# Patient Record
Sex: Male | Born: 1960 | Race: White | Marital: Married | Smoking: Current every day smoker
Health system: Northeastern US, Academic
[De-identification: ages and names within clinical notes are randomized; demographics above are authoritative.]

## PROBLEM LIST (undated history)

## (undated) DIAGNOSIS — B029 Zoster without complications: Secondary | ICD-10-CM

## (undated) HISTORY — DX: Zoster without complications: B02.9

---

## 2010-07-07 ENCOUNTER — Encounter: Payer: Self-pay | Admitting: Primary Care

## 2010-07-07 ENCOUNTER — Ambulatory Visit: Payer: Self-pay | Admitting: Primary Care

## 2010-07-07 ENCOUNTER — Encounter: Payer: Self-pay | Admitting: Gastroenterology

## 2010-07-07 NOTE — Miscellaneous (Unsigned)
 Continuity of Care Record  Created: todo  From: ,   From:   From: [employer], EHR v10.2.7.53  To: Melgoza, Lavon  Purpose: Patient Use;       Family History  Paternal history of Lung Cancer (V16.1)   Maternal history of Dementia 28    Social History  Being A Social Drinker  Occupation:  Engineer, structural / Residence In [place]  History of Tobacco Use 1976 Resolved (V15.82) ; Resolved: Dec 2011  Marital History - Currently Married Cindy  No History of Teacher, adult education - Seatbelts  No History of Drug Use  No History of Guns In The Home  No History of Housing Without Smoke Detectors  No History of Military History    Alerts  Allergy - No Known Drug Allergy

## 2010-07-07 NOTE — Progress Notes (Signed)
 Reason For Visit   Establish care.  HPI   HTN....  historically his BP is high on first BP check though comes down with rest.    no cp or sob.  exercise is nothing formally.     Low back pain...  comes and goes with certain activity.  started with moving a heavy cylinder   out of the car.  no radiation.  no weakness.  no numbness.  no fevers.  no   change in bowel or bladder habits.  pain typically occurs with lying on his   stomach in bed, though rarely is interfering with daily activity.     this is a new patient to the strong system and therefore his family   history, social history, and medical history was populated today.  Allergies   No Known Drug Allergy.  Current Meds   None.  PMH   Herpes Zoster (Shingles) 2011 (053.9).  PSH   Arthrotomy Of Knee With Medial And Lateral Meniscectomy  Surgery Of Male Genitalia Vasectomy (V25.2).  Family Hx   Maternal history of Dementia 59  Paternal history of Lung Cancer.  Personal Hx   Being A Social Drinker  No Drug Use  No Guns In The Home  No Housing Without Smoke Detectors  Marital History - Currently Married Administrator, sports; 2 kids  No Military History  Occupation:; starting up a reality business  Tobacco Use 1976 Resolved (V15.82); Resolved: Dec 2011  Travel / Residence In The [place]  No Teacher, adult education - Seatbelts.  ROS   Systemic symptoms: No fever.  Head symptoms: Headache  come and go in the temple region and seem to occur   about 1-2 x month and caused by computer work.  Cardiovascular symptoms: No chest pain or discomfort.  Pulmonary symptoms: No dyspnea  and no cough.  Gastrointestinal symptoms: Normal appetite, no nausea, no vomiting, and no   abdominal pain.  Genitourinary symptoms: No urinary symptoms.  Neurological symptoms: No dizziness  and no lightheadedness.  Psychological symptoms: No depression.  Skin symptoms: No rash  and no mole changes.  Vital Signs   Recorded by Marshall,Veronica on 07 Jul 2010 07:54 AM  BP:160/102,  RUE,  Sitting,   HR: 85  b/min,   Temp: 97.5 F,   Height: 68.5 in, Weight: 181 lb, BMI: 27.1 kg/m2,   O2 Sat: 98 (%SpO2).  Recorded by [email or web address] on 07 Jul 2010 08:10 AM  BP:139/89,  RUE,  Sitting.  Physical Exam   GENERAL: Alert, in no apparent distress.  Normal Habitus.  Appears stated   age.    PSYCH:  good attention, well groomed, oriented, normal affect  SKIN: No petechiae, no jaundice, dry, good turgor  EYES: Anicteric, no exophthalmos.  Lids not drooping.  Sclera normal.    Pupils Equally round.  EARS: Normal pinnae.   Acuity to conversational tones is good.  MOUTH: Moist mucous membranes, normal pharynx.  Gums pink.  good dentition.  NECK: Nontender spinous processes.  Full ROM grossly.  trachea midline.  no   thyromegaly.  LYMPH:  no cervical or supraclavicular lymphadenopathy.    CHEST: Clear to auscultation bilaterally with good air movement and no   wheezes or rales.  No accessory muscle usage.  Respirations unlabored.    Chest wall movement symmetric.  CARDIAC: Regular without gallops or rubs;  no heaves, no thrills;  no   murmurs.  VASCULAR:  Capillary refill within 2 seconds.  No edema.  ABDOMEN: Soft, nontender, nondistended,  no overt hepatosplenomegaly, no   pulsatile mass or detectable aortic widening, no guarding, no rebound.  EXTREMITIES: No clubbing, no synovitis.  No obvious joint deformities.    NEURAL EXAM: Normal speech.  Gait coordinated and smooth.  Observed   dexterity without tremor.  Assessment   1.  Hypertension.  By history he states that he has had reactive   hypertension and this is supported by what I see today.  However I did   request the patient to check his blood pressure about once per week at home   on a home blood pressure readings and that if he is getting blood pressures   greater than 130/80 that he return to be reevaluated.  I counseled the   patient about the importance of aerobic exercise.  I also counseled the   patient about the benefits that likely would be seen with losing 5-10    pounds.  FU one year.  Signature   Electronically signed by: Malena Catholic  M.D.; 07/07/2010 8:22 AM EST.

## 2012-06-11 ENCOUNTER — Other Ambulatory Visit: Payer: Self-pay | Admitting: Primary Care

## 2012-06-11 ENCOUNTER — Encounter: Payer: Self-pay | Admitting: Primary Care

## 2012-06-11 DIAGNOSIS — G8929 Other chronic pain: Secondary | ICD-10-CM | POA: Insufficient documentation

## 2012-06-11 DIAGNOSIS — Z0000 Encounter for general adult medical examination without abnormal findings: Secondary | ICD-10-CM

## 2012-06-11 DIAGNOSIS — I10 Essential (primary) hypertension: Secondary | ICD-10-CM | POA: Insufficient documentation

## 2012-06-11 DIAGNOSIS — M545 Low back pain, unspecified: Secondary | ICD-10-CM | POA: Insufficient documentation

## 2012-06-21 ENCOUNTER — Ambulatory Visit
Admit: 2012-06-21 | Discharge: 2012-06-21 | Disposition: A | Discharge status: Home / Self Care | Payer: Self-pay | Source: Ambulatory Visit | Attending: Primary Care | Admitting: Primary Care

## 2012-06-21 LAB — URINALYSIS REFLEX TO CULTURE
Blood,UA: NEGATIVE
Ketones, UA: NEGATIVE
Leuk Esterase,UA: NEGATIVE
Nitrite,UA: NEGATIVE
Protein,UA: NEGATIVE mg/dL
Specific Gravity,UA: 1.013 (ref 1.002–1.030)
pH,UA: 6 (ref 5.0–8.0)

## 2012-06-21 LAB — PSA (EFF.4-2010): PSA (eff. 4-2010): 0.3 ng/mL (ref 0.00–4.00)

## 2012-06-21 LAB — COMPREHENSIVE METABOLIC PANEL
ALT: 24 U/L (ref 0–50)
AST: 25 U/L (ref 0–50)
Albumin: 4.7 g/dL (ref 3.5–5.2)
Alk Phos: 65 U/L (ref 40–130)
Anion Gap: 13 (ref 7–16)
Bilirubin,Total: 0.2 mg/dL (ref 0.0–1.2)
CO2: 26 mmol/L (ref 20–28)
Calcium: 9.7 mg/dL (ref 8.6–10.2)
Chloride: 101 mmol/L (ref 96–108)
Creatinine: 0.89 mg/dL (ref 0.67–1.17)
GFR,Black: 112 *
GFR,Caucasian: 97 *
Glucose: 107 mg/dL — ABNORMAL HIGH (ref 60–99)
Lab: 14 mg/dL (ref 6–20)
Potassium: 4.8 mmol/L (ref 3.3–5.1)
Sodium: 140 mmol/L (ref 133–145)
Total Protein: 7.2 g/dL (ref 6.3–7.7)

## 2012-06-21 LAB — LDL CHOLESTEROL, DIRECT: LDL Direct: 121 mg/dL

## 2012-06-21 LAB — LIPID PANEL
Chol/HDL Ratio: 7.6
Cholesterol: 229 mg/dL — AB
HDL: 30 mg/dL
Non HDL Cholesterol: 199 mg/dL
Triglycerides: 461 mg/dL — AB

## 2012-06-21 LAB — CBC AND DIFFERENTIAL
Baso # K/uL: 0 10*3/uL (ref 0.0–0.1)
Basophil %: 0.2 % (ref 0.2–1.2)
Eos # K/uL: 0.1 10*3/uL (ref 0.0–0.5)
Eosinophil %: 1.3 % (ref 0.8–7.0)
Hematocrit: 44 % (ref 40–51)
Hemoglobin: 14.9 g/dL (ref 13.7–17.5)
Lymph # K/uL: 2.8 10*3/uL (ref 1.3–3.6)
Lymphocyte %: 30.1 % (ref 21.8–53.1)
MCV: 93 fL — ABNORMAL HIGH (ref 79–92)
Mono # K/uL: 0.8 10*3/uL (ref 0.3–0.8)
Monocyte %: 8.8 % (ref 5.3–12.2)
Neut # K/uL: 5.6 10*3/uL — ABNORMAL HIGH (ref 1.8–5.4)
Platelets: 247 10*3/uL (ref 150–330)
RBC: 4.8 MIL/uL (ref 4.6–6.1)
RDW: 13.7 % (ref 11.6–14.4)
Seg Neut %: 59.6 % (ref 34.0–67.9)
WBC: 9.4 10*3/uL — ABNORMAL HIGH (ref 4.2–9.1)

## 2012-06-21 LAB — HEMOGLOBIN A1C: Hemoglobin A1C: 5.7 % (ref 4.0–6.0)

## 2012-06-21 LAB — TSH: TSH: 2.93 u[IU]/mL (ref 0.27–4.20)

## 2012-06-22 LAB — HEPATITIS C ANTIBODY: Hep C Ab: NEGATIVE

## 2012-07-05 ENCOUNTER — Encounter: Payer: Self-pay | Admitting: Primary Care

## 2012-07-05 ENCOUNTER — Ambulatory Visit: Payer: Self-pay | Admitting: Primary Care

## 2012-07-05 VITALS — BP 120/82 | HR 55 | Temp 98.6°F | Ht 68.5 in | Wt 180.0 lb

## 2012-07-05 DIAGNOSIS — F17200 Nicotine dependence, unspecified, uncomplicated: Secondary | ICD-10-CM

## 2012-07-05 DIAGNOSIS — M25572 Pain in left ankle and joints of left foot: Secondary | ICD-10-CM

## 2012-07-05 DIAGNOSIS — Z0000 Encounter for general adult medical examination without abnormal findings: Secondary | ICD-10-CM

## 2012-07-05 DIAGNOSIS — E785 Hyperlipidemia, unspecified: Secondary | ICD-10-CM | POA: Insufficient documentation

## 2012-07-05 LAB — HM HIV SCREENING OFFERED

## 2012-07-05 NOTE — H&P (Signed)
Reason For Visit   Adult Comprehensive Physical Exam.    HPI   Today has concerns of:  1.  Injured left ankle in May 2013.  Still with some discomfort with walking, especially in the achilles region, and pain can radiate into the calf pain.  2.  Spot on back that his wife has noticed.  No known changes.  No pain.  3.  Small lump on back of right knee.   No pain.  4.  Toenail fungus.  "it is ugly".  Has not tried any treatment.    Entire chart (including Problem List, SH, FH, Immunizations, Medications, and Allergies) was reviewed with the patient today during the appointment and updates were made based upon our discussion.  Allergies   Allergen Reactions   . Venomil Wasp Venom (Wasp Venom) Anaphylaxis     Current Outpatient Prescriptions   Medication Sig Dispense Refill   . EPINEPHrine (EPIPEN) 0.3 MG/0.3ML DEVI Inject 0.3 mg into the muscle once       . B Complex Vitamins (VITAMIN B COMPLEX PO) Take by mouth         No current facility-administered medications for this visit.       Patient Active Problem List    Diagnosis Date Noted   . Nicotine dependence 07/05/2012   . Reactive hypertension 06/11/2012   . Chronic low back pain 06/11/2012     Past Medical History   Diagnosis Date   . Herpes zoster without mention of complication      Past Surgical History   Procedure Laterality Date   . Meniscectomy Right      Medial and Lateral Meniscectomy   . Vasectomy       Family History   Problem Relation Age of Onset   . Dementia Mother 31   . Lung cancer Father    . Breast cancer Mother 59     History     Social History   . Marital Status: Married     Spouse Name: Arline Asp     Number of Children: 2   . Years of Education: 16     Occupational History   . Reality Business      Social History Main Topics   . Smoking status: Current Every Day Smoker -- 0.50 packs/day     Types: Cigarettes     Start date: 05/18/1974   . Smokeless tobacco: Never Used   . Alcohol Use: 1 - 1.5 oz/week     2-3 drink(s) per week   . Drug Use: No   .  Sexually Active: Yes -- Male partner(s)     Other Topics Concern   . None     Social History Narrative   . None     ROS   Systemic symptoms: No fever.  Head symptoms:  Infrequent headache.  Eye symptoms: No vision problems, except for readers  Cardiovascular symptoms: No chest pain or discomfort.  Pulmonary symptoms: No dyspnea  and no cough.  Gastrointestinal symptoms: Normal appetite, no nausea, no vomiting,   Genitourinary symptoms: Nocturia x 0  Neurological symptoms: No dizziness  and no lightheadedness.  Psychological symptoms: No depression.  Skin symptoms: No rash.    There is no immunization history on file for this patient.    Filed Vitals:    07/05/12 1110   BP: 120/82   Pulse: 55   Temp: 37 C (98.6 F)   Height: 1.74 m (5' 8.5")   Weight: 81.647 kg (180 lb)  BP  138/90 RUE sitting  Body mass index is 26.97 kg/(m^2).  Physical Exam   GENERAL: Alert, in no apparent distress, good attention, well groomed.  Oveweight Habitus.  Appears stated age  SKIN: No petechiae, no jaundice, no tinea pedis, back with 4mm flesh colored papule.  Posterior right thigh with 3mm sq nodule.  EYES: Anicteric, no exophthalmos.  Lids not drooping.  Sclera normal.  EARS: Normal pinnae.  External auditory canal intact, clear, and without lesions.  Normal tympanic membrane with light reflex and landmarks.  Acuity to conversational tones is good.  NOSE: Normal pink mucosa without swollen turbinates.  Septum appears deviated to the left.  MOUTH: Moist mucous membranes, normal pharynx, palate symmetrical, no thrush, normal tongue, no tonsillar exudate.  Gums pink.  NECK: Nontender spinous processes.  Full ROM grossly.  THYROID: Normal size, nontender, no masses.  LYMPH:  no cervical or supraclavicular lymphadenopathy.  No axillary lymphadenopathy.  BREASTS: Symmetrical, no dimpling, no nipple discharge.  CHEST: Clear to auscultation bilaterally with good air movement and no wheezes or rales.  No accessory muscle usage.   Respirations unlabored.  Chest wall movement symmetric.  CARDIAC: Regular without gallops or rubs;  no heaves, no thrills;  no murmurs.  VASCULAR: 2+ bilateral dorsal pedis pulses, 2+ bilateral posterior tibialis pulses, positive hair on toes. No carotid bruit, no aortic bruit, no renal bruit, no supraclavicular bruit.  Capillary refill within 2 seconds.  No edema.  ABDOMEN: Soft, nontender, nondistended, good bowel sounds, no overt hepatosplenomegaly, no pulsatile mass or detectable aortic widening, no guarding, no rebound.  BACK: No CVA tenderness, no spinous process tenderness, no scoliosis  EXTREMITIES: No clubbing, no synovitis.  No obvious joint deformities.  Muscle tone and gross strength assessment appear normal for age,  without atrophy.  GU:    Normal testis.  Normal epididymis.  Normal penis without discharge or ulcerations.  Small right inguinal hernia.  RECTUM:  No masses, brown stool, normal sphincter tone.  No hemorrhoids.  Perineum and anus without lesions, or masses.  Prostate exam limited by pt resistance.  NEURAL EXAM: Normal speech, normal sensation to light touch, no clonus, negative straight leg raises.   Gait coordinated and smooth.  Observed dexterity without tremor.  Recall of recent and remote events appears normal.    POCT - EKG:    EKG Interpretation:   NSR      Rate:  62  Comments:  Inverted Jeanmarie Mccowen wave in lead 3.  Borderline intraventricular conduction delay with QRS interval of 0.1 seconds.  No old EKG.  No acute changes      Chemistry        Lab results: 06/21/12  0825   Sodium 140   Potassium 4.8   Chloride 101   CO2 26   GFR,Caucasian 97   GFR,Black 112   UN 14   Creatinine 0.89        Lab results: 06/21/12  0825   Glucose 107*   Calcium 9.7   Total Protein 7.2   Albumin 4.7   ALT 24   AST 25   Alk Phos 65   Bilirubin,Total 0.2          Lab Results   Component Value Date    WBC 9.4* 06/21/2012    HGB 14.9 06/21/2012    HCT 44 06/21/2012    MCV 93* 06/21/2012    PLT 247 06/21/2012     Lab Results    Component Value Date  CHOL 229* 06/21/2012    HDL 30 06/21/2012    LDLC see below 06/21/2012    TRIG 461* 06/21/2012    CHHDC 7.6 06/21/2012     Hemoglobin A1C   Date Value Range Status   06/21/2012 5.7  4.0 - 6.0 % Final                                    NON-DIABETIC       Lab Results   Component Value Date    TSH 2.93 06/21/2012     Assessment   Mark Mcneil comes in today for a physical exam and the following issues were identified:  1.  Nicotine dependence.  Talked with the patient about the importance of quitting smoking.  He states that he is starting to think about quitting.  States that he is tried Chantix previously (however only for one to 2 weeks) without benefit.  Has previously tried Wellbutrin as well without benefit.  Wants to try quitting cold [place].  Counseled the patient that if this is unsuccessful, he may want to try Chantix again for a longer period of time.  Pneumococcal vaccine provided.  2.  History of Chronic low back pain.  States that currently his pain is not an issue  3.  Hyperlipidemia with triglycerides of 461 mg/dL and HDL of 30 mg/dL.  Counseled the patient about the option of starting a cholesterol-lowering medication.  He wants to work on lifestyle modification first.  Counseled the patient extensively about specifics in regards to diet and exercise.  Recommend repeat lipid profile in 3 months and this was ordered.  4.  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  Appears benign.  No indication for further treatment at this time.  5. I did not see any concerning skin lesions on his back and he was not able to point to a specific lesion of concern.  6. Left ankle pain following traumatic injury in May 2013.  Suspicious for Achilles rupture.  Referred to orthopedics.  7.  Health maintenance.  Counseled the patient about the importance of following through with colonoscopy.  8.  Onychomycosis.  Counseled the patient about the option of oral Lamisil.  He is going to try topical  vapor rub first.  9.  Father with lung cancer.  Patient is a smoker.  Recommend low dose chest CT when he turns 52 years old; and this was discussed with the patient    Health Maintenance   Topic Date Due   . Imm-pneumococcal Vaccine Age 12-64 High Risk  05/25/1964   . Hiv Testing Offered  05/26/1971   . Imm-influenza 9 Y + Up  01/17/2012     Coun/Edu    --Health care proxy encouraged  --Lab results discussed; see scanned image  --Diet reviewed and discussed  --Body Weight reviewed and discussed         Body mass index is 26.97 kg/(m^2).   --Aerobic exercise discussed; not much  --Tobacco use discussed; 1/2 PPD though thinking of trying to quit.  --Alcohol use discussed; 2-3 beers per week  --Recreational drug use discussed; no recent usage  --Colon CA screening discussed. Refer to [employer]  --Stool cards X 3 given to patient  --Testicular self exam discussed  --Prostate cancer screening discussed   --STD risk counseling and prevention discussed, no HIV concerns, declines HIV test  --Safe sex/contraception discussed  --Immunizations reviewed         >>  Yearly Influenza Shot Advised  --Hepatitis C screening for persons born [birth year] to 1967  - negative  --Depression screening evaluation performed today (PHQ-2)         >>Little interest/Pleasure in doing things more than half of the past 2 weeks  --> no         >>Feeling down, depressed, or hopeless more than half of the past 2 weeks. --> no  --Skin CA awareness/prevention discussed  --Dental care discussed and encouraged  --Hearing discussed and mild decrease with background noise  --ADLS reviewed and is independent  --Home Safety discussed  --Cardiac risk factor modification discussed  --Motor vehicle safety discussed   --Next H&P:  2 yr

## 2012-07-05 NOTE — Worker's Comp (Deleted)
Worker's Compensation  Mark Mcneil  [id number]  [birth year]    Reason for Visit:  02-20-1961, [id number], is a 52 y.o. year old male who presents for assessment of a work related condition on 07/05/2012.     HPI:  Informant: {INFORMANT WITH RELATIVES:21565} Relationship: {PERSONS; IMPAIRED RELATIONSHIPS:23037}  Interpreter present:   Language:  Date of injury/illness: ***  Date of initial visit: ***  Seen in another facility:  {YES NO:22742}, where/when:  Describe injury/illness:   History/patient complains of   Pre-existing condition prior to this injury: {YES NO:22742}    Exam:  Physical Exam  {GEN PHYSICAL XBJY:7829562}    Assessment/Plan:  ***  Plan: ***  Referral: {YES NO:22742}  Disabled:{Desc; none/partial/total:22479} Date disabled: ***  Is patient working? {YES NO:22742}  Is incident the cause of the injury or illness? {YES NO:22742}  Consistent with history of injury or illness? {YES NO:22742}  History is consistent with objective findings? {YES NO:22742}  Is patient disabled from regular duties? {YES NO:22742}  % temporary impairment: ***  Can patient do any type of work? {YES NO:22742}  May the injury result in permanent restriction, total or partial loss of function of a part or member, or permanent facial, head or neck disfigurement? {YES NO:22742}  Has patient reached maximal medical improvement? {YES NO:22742}  When will the patient be seen again? ***

## 2012-07-11 ENCOUNTER — Encounter: Payer: Self-pay | Admitting: Orthopedic Surgery

## 2012-07-11 ENCOUNTER — Ambulatory Visit: Payer: Self-pay | Admitting: Orthopedic Surgery

## 2012-07-11 VITALS — BP 144/74 | Ht 68.5 in | Wt 180.0 lb

## 2012-07-11 DIAGNOSIS — M25572 Pain in left ankle and joints of left foot: Secondary | ICD-10-CM

## 2012-07-11 NOTE — Patient Instructions (Addendum)
Dear Otilio Miu,    Your physician has determined that you require durable medical equipment (DME) as a part of your treatment.  Knee braces, cast boots, walking boots, crutches, etc. Are DME.  These items offer protection and provide for your safety.  The type and quality of DME has been prescribed for you by your provider.      We cannot determine how much of the cost of this product will be paid by your insurance carrier.  Therefore, you may receive a bill for the outstanding balance.  It is your responsibility to pay whatever fee your insurance carrier does not.    DME Return Policy:     Braces and boots are not returnable if work outside of clinic due to hygiene concerns.   Poorly fitting braces can be exchanged for a correct fit within 1 week if the DME is in excellent condition.   DME that was not dispensed by [facility] will not be accepted.    If DME must be returned, it must be returned to the office that dispensed it.   Special order braces are billed at the time of order and are non-refundable.   Brace parts can be ordered and replaced if they become damaged or worn out.  This may include a charge.    Your type of brace: Other - high tide fracture boot medium    Patient Signature: __________________________  07/11/2012 Dear Otilio Miu,    Your physician has determined that you require durable medical equipment (DME) as a part of your treatment.  Knee braces, cast boots, walking boots, crutches, etc. Are DME.  These items offer protection and provide for your safety.  The type and quality of DME has been prescribed for you by your provider.      We cannot determine how much of the cost of this product will be paid by your insurance carrier.  Therefore, you may receive a bill for the outstanding balance.  It is your responsibility to pay whatever fee your insurance carrier does not.    DME Return Policy:     Braces and boots are not returnable if work outside of clinic due  to hygiene concerns.   Poorly fitting braces can be exchanged for a correct fit within 1 week if the DME is in excellent condition.   DME that was not dispensed by [facility] will not be accepted.    If DME must be returned, it must be returned to the office that dispensed it.   Special order braces are billed at the time of order and are non-refundable.   Brace parts can be ordered and replaced if they become damaged or worn out.  This may include a charge.    Your type of brace: Other - heel lifts medium    Patient Signature: __________________________  07/11/2012

## 2012-07-11 NOTE — Progress Notes (Signed)
PATIENTIRBY, DOLES  MR #:  [id number]   ACCOUNT #:  [id number] DOB:  1960-07-20   DICTATED BY:  Lamar Sprinkles, MD DATE OF VISIT:  07/11/2012       RE:   Mark Mcneil     Dear Dr. Orvan Falconer:     We had the pleasure of seeing your patient, Mark Mcneil, today in clinic in consultation for his left Achilles-based pain.  As you are well aware, he is very pleasant 52 year old gentleman who sustained an injury to his left Achilles back in May of 2013.  He had a previous right-sided injury that was treated back in [place] with 8 weeks of casting.  He noted that he did have an injury which was like to his Achilles tendon on his right side per his recollection.  He did treat this with immobilization as well as rest.  He still had ongoing discomfort and issues without any relief.  He notes the pain is located over the his heel, which extends into the calf.  He notes swelling.  He has not had any specific re-injuries.  He denies any numbness or tingling.     We reviewed the patient's past medical history, past surgical history, social history, and medications as documented in the [facility] initial intake form.    PHYSICAL EXAMINATION:  On physical examination today, the patient is in no acute distress.  The patient has normal mood and affect.  Evaluation of the left lower extremity reveals some swelling with intact skin over his Achilles.  He has tenderness to palpation globally over the distal portion of the Achilles tendon.  It is palpable without any signs of defects/  he has adequate tensioning on Matles knee flexion test and he has positive plantar flexion on Custer test.  He is otherwise distally neurovascularly intact to his left lower extremity.    DIAGNOSTIC IMAGING:  Three views of the left ankle were reviewed.  Images reveal no evidence of acute fracture or dislocation.  He has normal osseous anatomy noted.    ASSESSMENT AND PLAN:  At this point, the patient seems to have signs and  symptoms consistent with Achilles tendinitis.  In the setting of his history of probable partial or complete rupture with ongoing swelling despite adequate immobilization, anti-inflammatories, and physical therapy exercises which he has been doing on his own, I think he is having ongoing issues and pain that has persisted for longer than 6 months.  I would like to evaluate the status of his tendon to delineate between a complete or partial tear or isolated tendinitis, which would help guide our course and possibly lead [place] towards surgical intervention given the findings.  I will see him back after his MRI is complete.  We have placed him into a boot with heel lifts, which should help with his discomfort in the interim.             ______________________________  Lamar Sprinkles, MD    JPK/MODL  DD:  07/11/2012 13:11:22  DT:  07/11/2012 15:01:55  Job #:  [id number]    cc: Lamar Sprinkles, MD   Dr. Alwyn Ren  EXTERNAL RECIPIENT

## 2012-09-01 ENCOUNTER — Encounter: Payer: Self-pay | Admitting: Primary Care

## 2013-09-15 ENCOUNTER — Ambulatory Visit: Payer: Self-pay | Admitting: Primary Care

## 2013-09-15 ENCOUNTER — Encounter: Payer: Self-pay | Admitting: Primary Care

## 2013-09-15 VITALS — BP 139/80 | HR 78 | Temp 98.0°F | Ht 68.5 in | Wt 173.2 lb

## 2013-09-15 DIAGNOSIS — F17200 Nicotine dependence, unspecified, uncomplicated: Secondary | ICD-10-CM

## 2013-09-15 DIAGNOSIS — I10 Essential (primary) hypertension: Secondary | ICD-10-CM

## 2013-09-15 DIAGNOSIS — E785 Hyperlipidemia, unspecified: Secondary | ICD-10-CM

## 2013-09-15 DIAGNOSIS — Z0000 Encounter for general adult medical examination without abnormal findings: Secondary | ICD-10-CM

## 2013-09-15 NOTE — Progress Notes (Signed)
Chief Complaint   Patient presents with    Hypertension     HPI  1.  Reactive hypertension.  Has not been using his home blood pressure monitor.  No chest pain.  2.  Hyperlipidemia.  Has been working on diet and has gotten his weight down.  However exercise has not been consistent.  3.  Smoking.  He currently is off and on again smoking.    Medications were reviewed and reconciled with the patient; and No changes were made.  Current Outpatient Prescriptions   Medication Sig Dispense Refill    Multiple Vitamin (MULTIVITAMIN) TABS Take by mouth        EPINEPHrine (EPIPEN) 0.3 MG/0.3ML DEVI Inject 0.3 mg into the muscle once         No current facility-administered medications for this visit.     Allergies were reviewed confirmed with the patient  Allergies   Allergen Reactions    Venomil Wasp Venom [Wasp Venom] Anaphylaxis       Blood pressure 130/94, pulse 78, temperature 36.7 C (98 F), temperature source Temporal, height 1.74 m (5' 8.5"), weight 78.563 kg (173 lb 3.2 oz), SpO2 97 %.  Body mass index is 25.95 kg/(m^2).   BP  140/80  GENERAL: Alert, in no apparent distress, good attention, well groomed.    EYES: Anicteric, no exophthalmos.    CHEST: Clear to auscultation bilaterally with good air movement and no wheezes or rales.  No accessory muscle usage.  CARDIAC: Regular without  murmurs.   VASCULAR: No edema.  No cyanosis.     Assessment & plan   1.  Presumed reactive hypertension.  Followup 3 months and bring in home blood pressure monitor.  Work on lifestyle modification in the meantime.  2.  History of hyperlipidemia with severe hypertriglyceridemia.  Counseled the patient to really work on diet and exercise for the next 3 months.  Followup in 3 months with lab work prior to that appointment.  I suspect he is going to need to start on medication.  3.  Nicotine dependence.  Continue to encourage the patient to quit smoking.  4.  Father with lung cancer with ongoing smoking.  Consider low dose chest CT a  next appointment.

## 2013-09-19 ENCOUNTER — Telehealth: Payer: Self-pay | Admitting: Primary Care

## 2013-09-19 MED ORDER — EPINEPHRINE 0.3 MG/0.3ML IJ SOAJ *I*
0.3000 mg | INTRAMUSCULAR | Status: AC | PRN
Start: 2013-09-19 — End: ?

## 2013-09-19 NOTE — Telephone Encounter (Signed)
Patient needs refill on EpiPen, he is allergic to bee stings. I tried doing a normal refill request and got this message:        This medication record is no longer active in the system due to a database upgrade. Please choose an Alternative product below to continue ordering.   Please advise. I also doubled checked, [facility] [address] [place]

## 2013-09-19 NOTE — Telephone Encounter (Signed)
rx signed

## 2013-12-19 ENCOUNTER — Ambulatory Visit: Payer: Self-pay | Admitting: Primary Care

## 2014-01-31 ENCOUNTER — Ambulatory Visit: Payer: Self-pay | Admitting: Primary Care

## 2014-11-01 ENCOUNTER — Telehealth: Payer: Self-pay | Admitting: Primary Care

## 2014-11-01 NOTE — Telephone Encounter (Signed)
HTN letter mailed - stating this is a reminder that pt is due for Blood pressure check appt and to call to make appt

## 2015-03-01 NOTE — Telephone Encounter (Signed)
My chart message came back unread- I called spoke to pt he [facility]   . His wife is coming in for appt with [facility] at that time they will discuss this and about removing pt from  Practice .

## 2020-05-14 IMAGING — MR MRI LUMBAR SPINE WITHOUT CONTRAST
4 of 5 series · 21 of 48 positions shown · IV contrast (gadolinium)
Comparison: None

MRI LUMBAR SPINE WITHOUT CONTRAST, 05/14/2020 [DATE]: 
CLINICAL INDICATION: Low back pain
TECHNIQUE: Multiplanar, multiecho position MR images of the lumbar spine were 
performed without intravenous gadolinium enhancement.

[Series 301: t1w tse sag · sagittal · 4.0mm · 0.50mm/px · 3 of 17 slices shown]
[im 4/17]
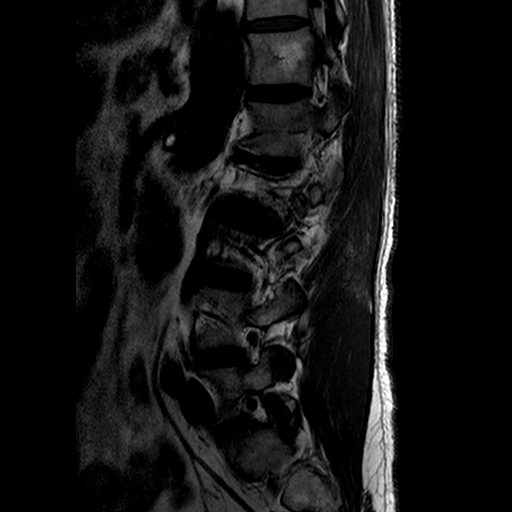
[im 10/17]
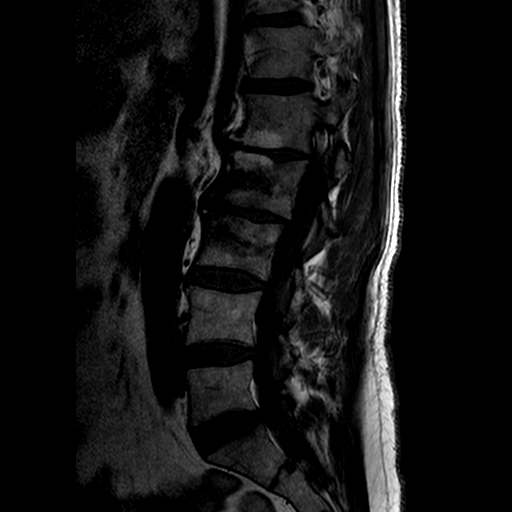
[im 17/17]
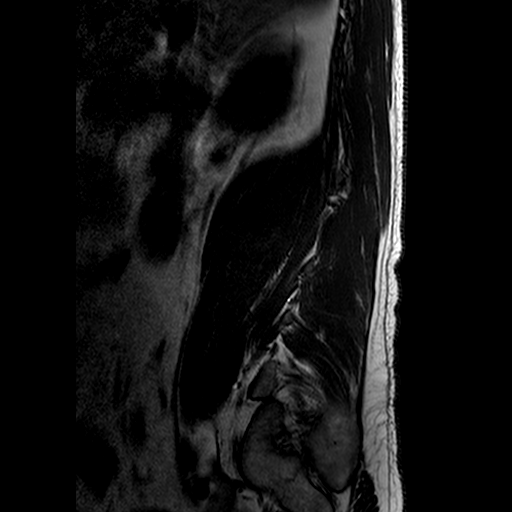

[Series 401: t2w tse sag · sagittal · 4.0mm · 0.48mm/px · 3 of 17 slices shown]
[im 3/17]
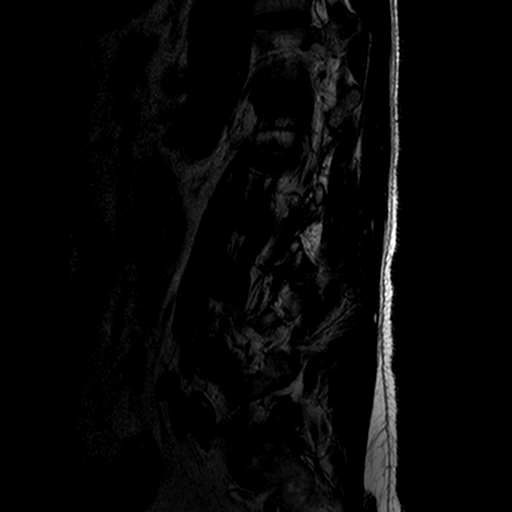
[im 9/17]
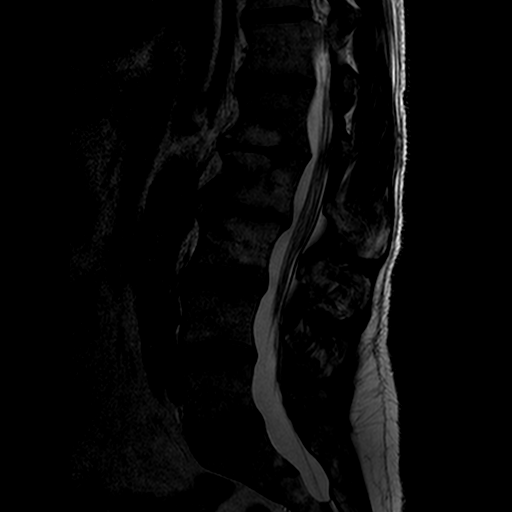
[im 14/17]
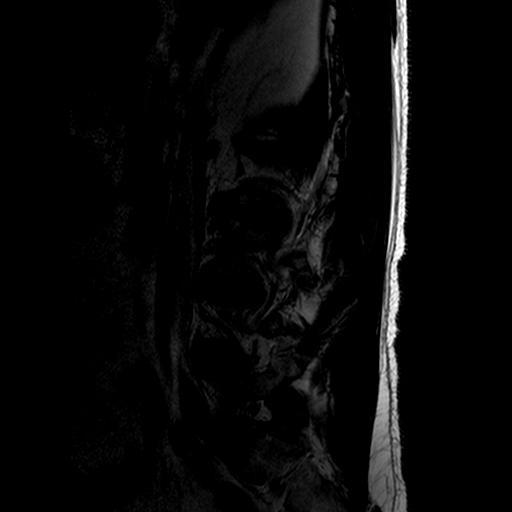

[Series 601: T2 · axial · 4.0mm · 0.47mm/px · z∈[-73,+151]mm · 9 of 30 slices shown]
[im 1/30]
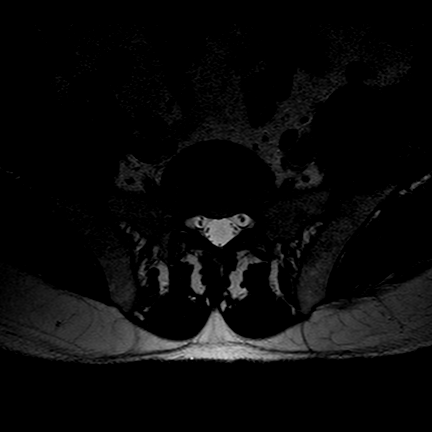
[im 5/30]
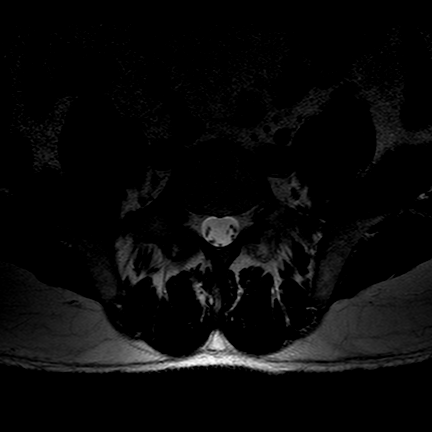
[im 10/30]
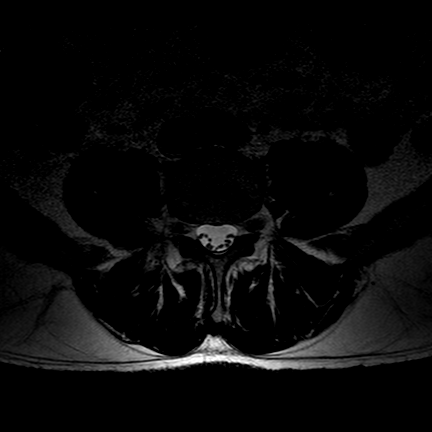
[im 13/30]
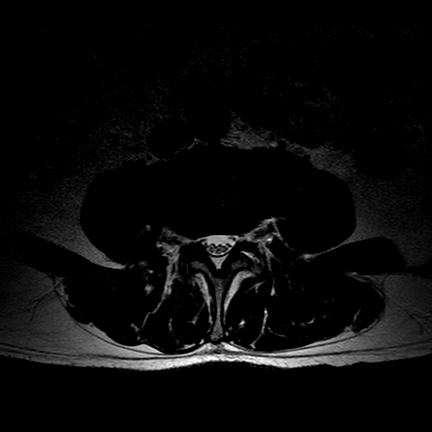
[im 15/30]
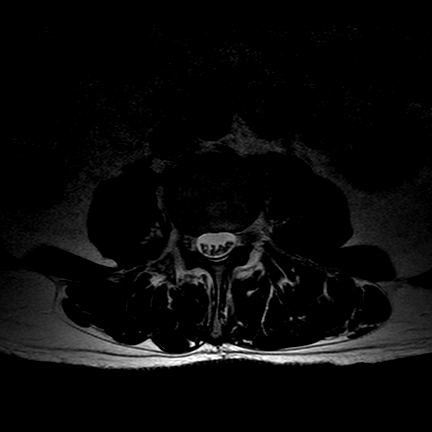
[im 17/30]
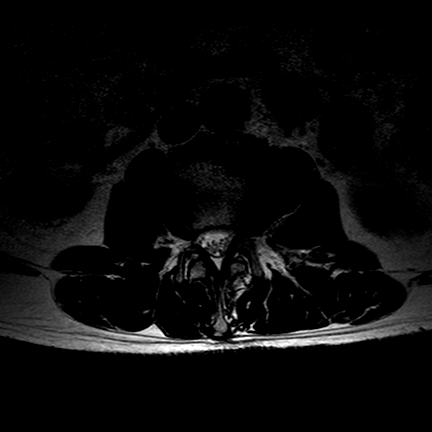
[im 20/30]
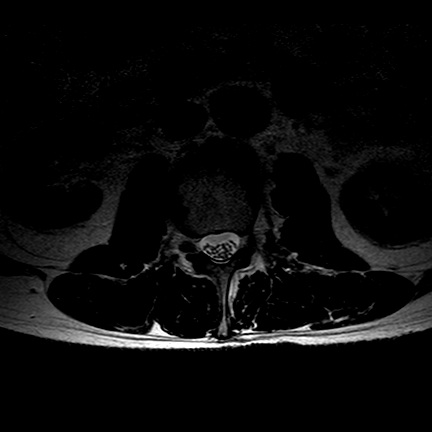
[im 25/30]
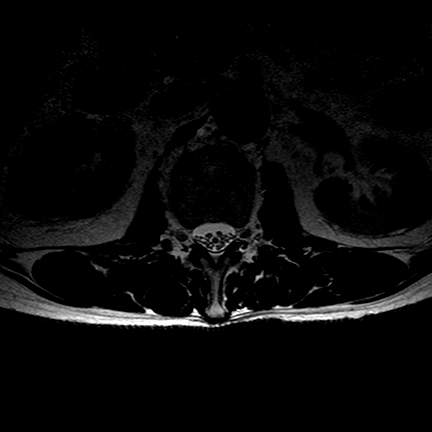
[im 30/30]
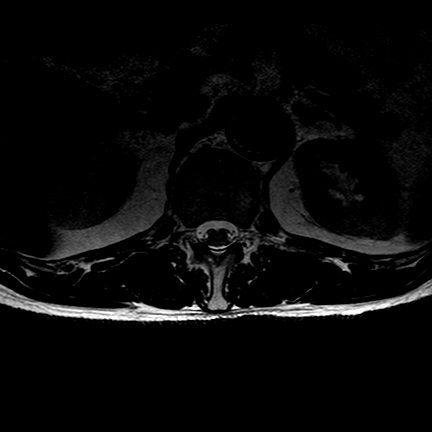

[Series 701: T1 · axial · 4.0mm · 0.35mm/px · z∈[-74,+110]mm · 6 of 35 slices shown]
[im 1/35]
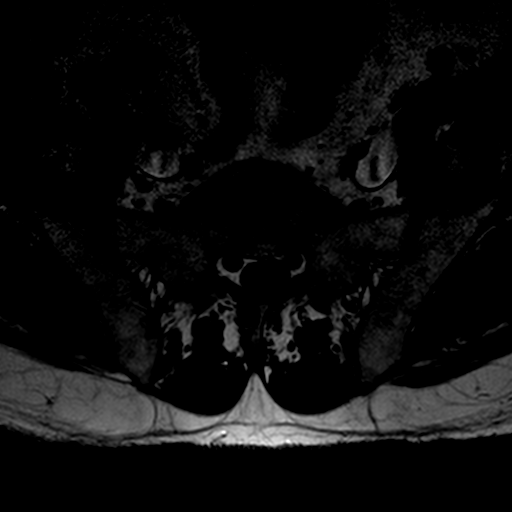
[im 5/35]
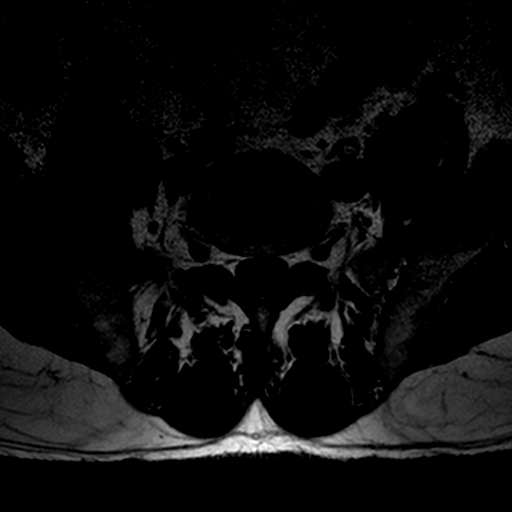
[im 10/35]
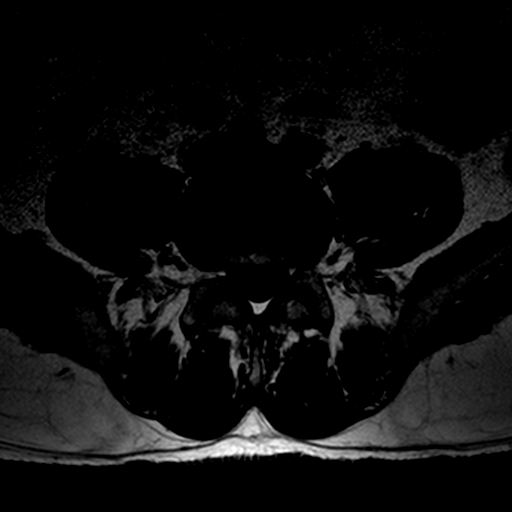
[im 15/35]
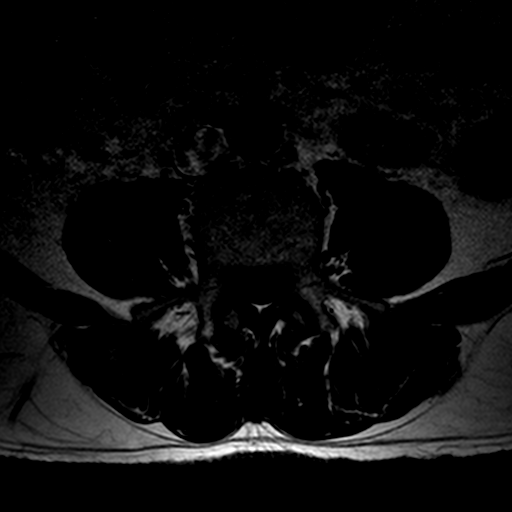
[im 18/35]
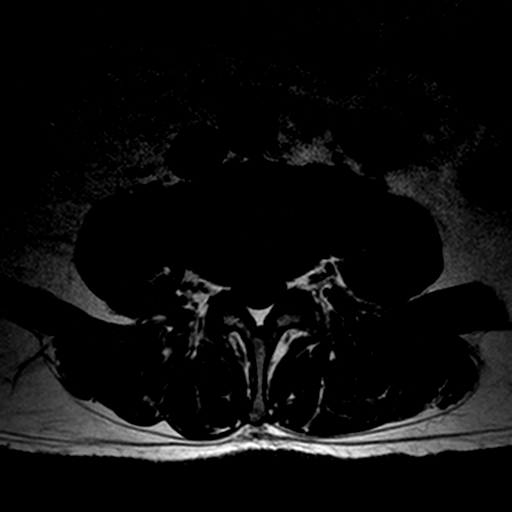
[im 30/35]
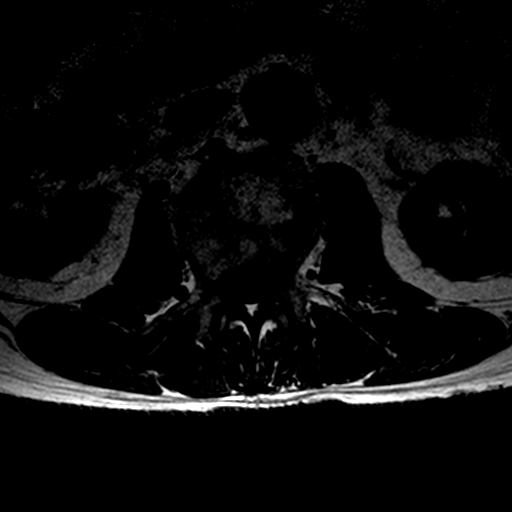

[21 of 48 positions shown; findings below may reference images not displayed]

FINDINGS: Lumbar vertebral heights are intact. There is extensive reactive 
endplate edema at L2-3. There is marked disc narrowing at L1-2 and L2-3. The 
lower 3 disc heights are preserved. 
Axial images show no significant canal stenosis. There is borderline-mild 
stenosis at L2-3 due to disc bulge and moderate facet changes with ligamentous 
thickening. At other disc levels the canal remains open. There is mild disc 
bulging at L1-2, L3-4 and L4-5. There is no significant lumbar foraminal 
stenosis. 
There is mild upper lumbar dextroscoliosis. No evidence for compression fracture 
or malignancy. The conus terminates opposite T12-L1, showing normal signal 
morphology. 
Abdominal aorta shows normal caliber. There is no evidence for upper sacral 
fracture.
IMPRESSION: Degenerative changes, most pronounced at L2-3 where there is extensive reactive 
endplate edema. This is worse to the left of midline. There is borderline-mild 
canal stenosis at this level due to disc bulge and ligamentous thickening, worse 
on the left. 
Mild upper lumbar dextroscoliosis. 
No evidence for compression fracture or malignancy.

## 2023-07-22 IMAGING — MR MRI LUMBAR SPINE WITHOUT CONTRAST
6 of 8 series · 12 of 48 positions shown · IV contrast (gadolinium)
Comparison: MRI lumbar spine from May 14, 2020.

________________________________________________________________________________________________ 
MRI LUMBAR SPINE WITHOUT CONTRAST, 07/22/2023 [DATE]: 
CLINICAL INDICATION: Low back pain.
TECHNIQUE: Multiplanar, multiecho position MR images of the lumbar spine were 
performed without intravenous gadolinium enhancement. Patient was scanned on a 
1.5T magnet

[Series 101: survey · axial · 10.0mm · 1.25mm/px · 1 of 10 slices shown]
[im 1/10]
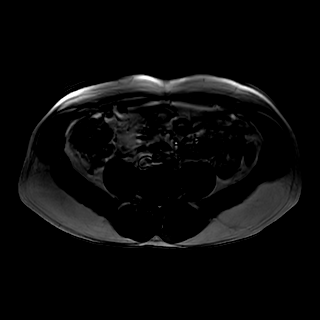

[Series 201: t2w_cor-surv · coronal · 6.0mm · 0.62mm/px · 1 of 10 slices shown]
[im 1/10]
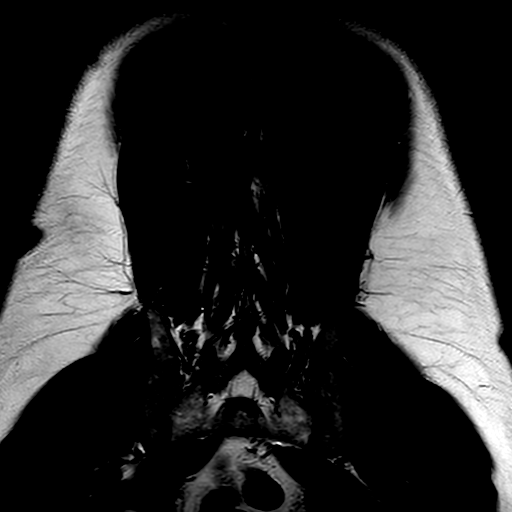

[Series 301: t1_tse_sag · sagittal · 4.0mm · 0.34mm/px · 3 of 19 slices shown]
[im 1/19]
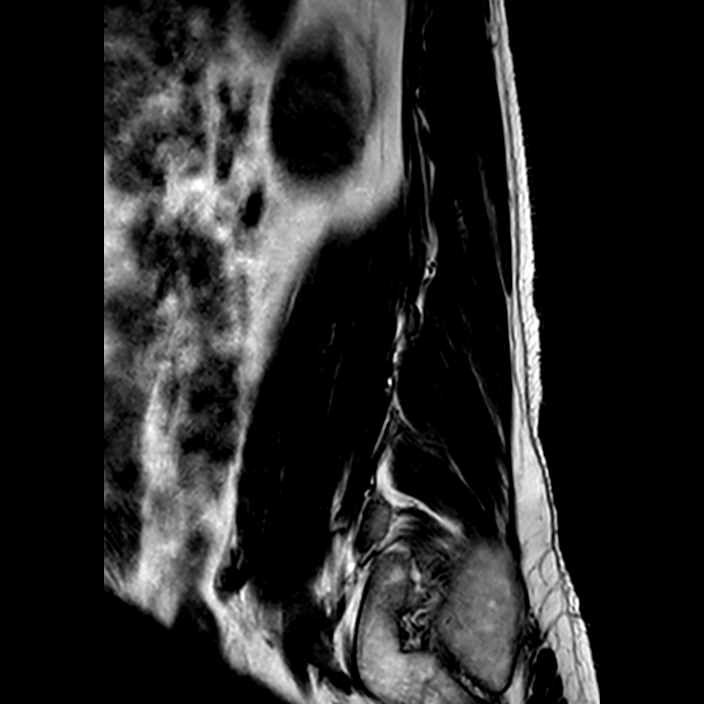
[im 10/19]
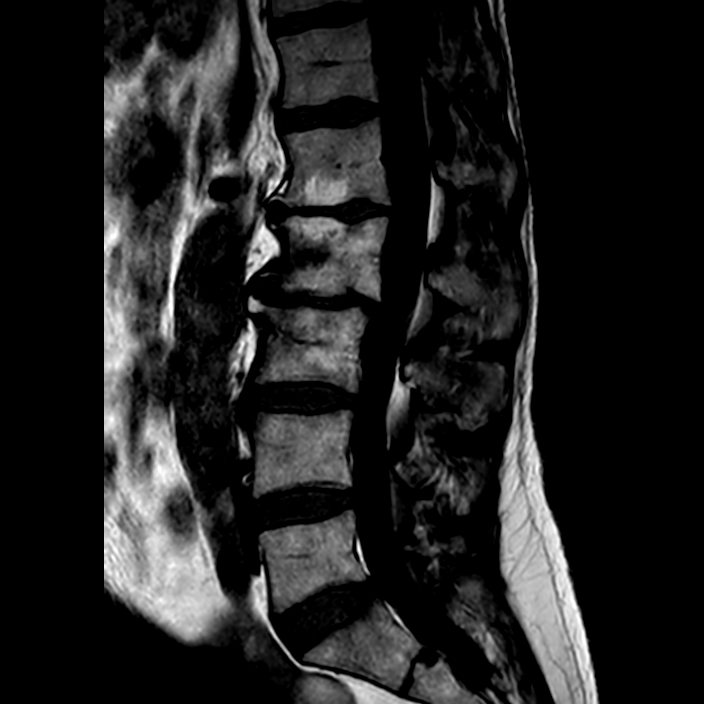
[im 19/19]
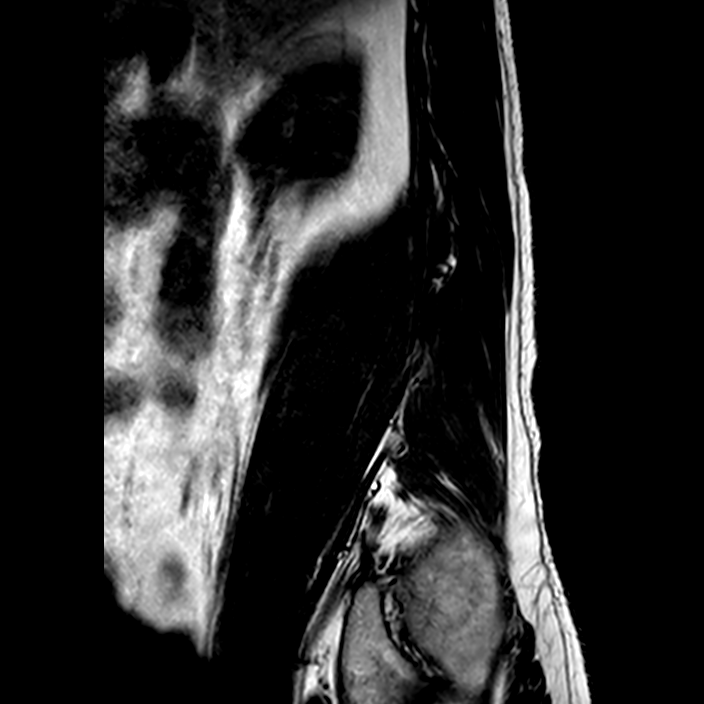

[Series 402: (id)_mdixon_tse · sagittal · 4.0mm · 0.51mm/px · 3 of 19 slices shown]
[im 1/19]
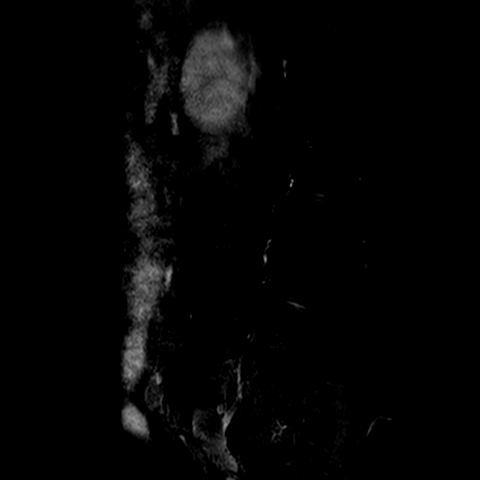
[im 10/19]
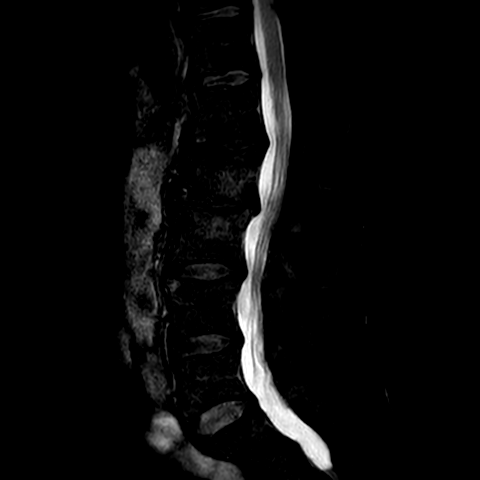
[im 19/19]
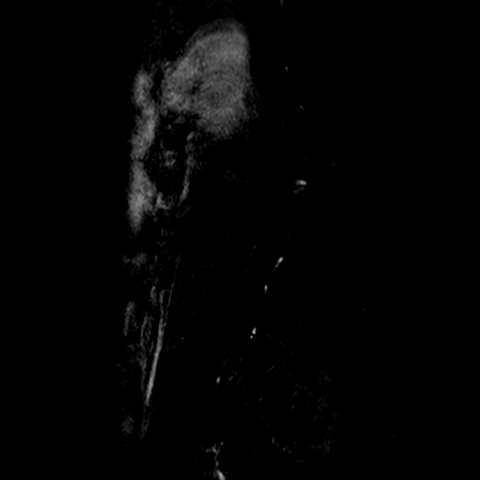

[Series 403: st2w_mdixon_tse · sagittal · 4.0mm · 0.51mm/px · 3 of 19 slices shown]
[im 1/19]
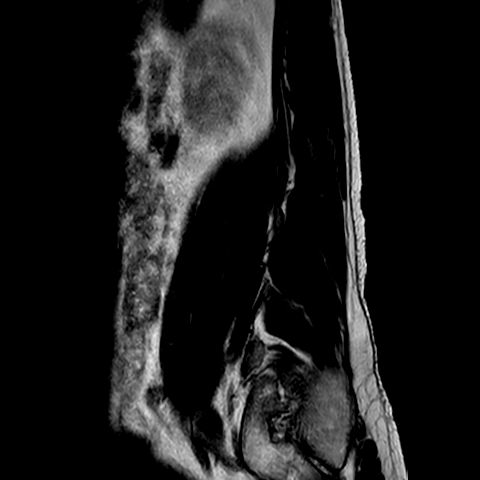
[im 10/19]
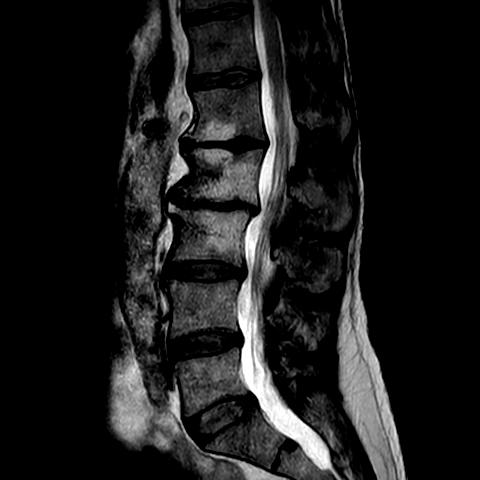
[im 19/19]
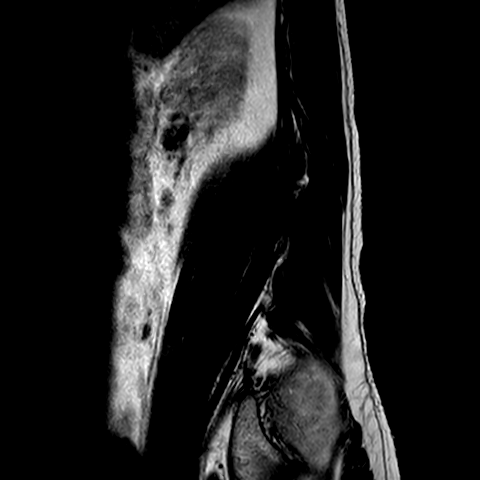

[Series 502: (id) view_ax mpr · axial · 1.0mm · 0.25mm/px · 1 of 108 slices shown]
[im 8/108]
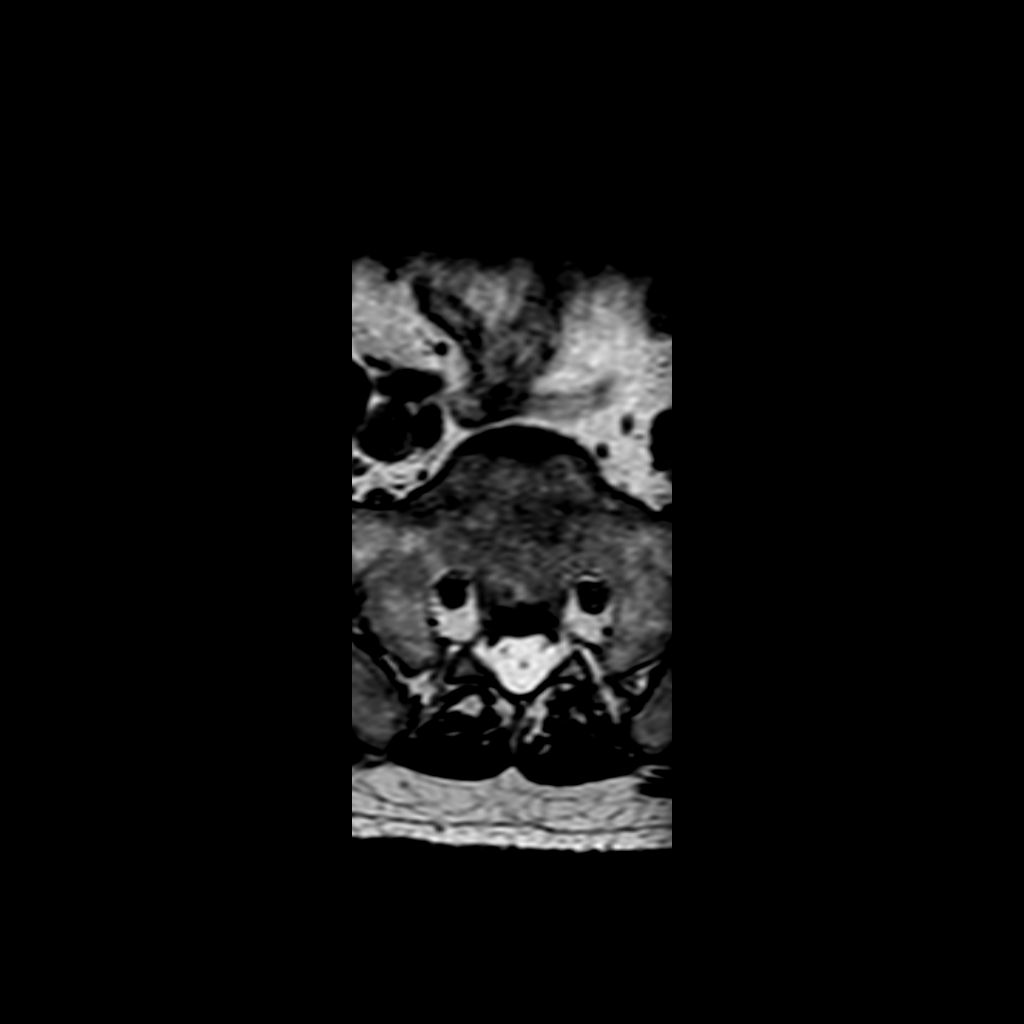

[12 of 48 positions shown; findings below may reference images not displayed]

FINDINGS: -------------------------------------------------------------------------------- 
------ 
GENERAL: 
Nomenclature is based on 5 lumbar type vertebral bodies.     
ALIGNMENT: Rightward curvature at L2-L3, left lateral subluxation of L1 relative 
to L2 and right lateral subluxation of L3 relative to L4. Mild retrolisthesis of 
L2 on L3, L3 on L4. 
VERTEBRAL BODY HEIGHT: Normal.  
MARROW SIGNAL: Active endplate change at L2-L3 centrally and towards left side. 
Active endplate change at L3-L4 anteriorly and towards left side. 
CORD SIGNAL: Normal distal spinal cord and cauda equina.  Conus terminates at L1 
inferior endplate level. 
ADDITIONAL FINDINGS: None. 
Modic I-II: Modic change at L2-L3 and L3-L4. 
Ligamentum Flavum > 2.5 mm: All levels. 
-------------------------------------------------------------------------------- 
------ 
SEGMENTAL: 
T12-L1: Trace disc bulge; no significant central canal narrowing.  No 
significant right neural foraminal narrowing. No significant left neural 
foraminal narrowing.  
L1-L2: Loss of disc height with disc bulge; no significant central canal 
narrowing.  No significant right neural foraminal narrowing. No significant left 
neural foraminal narrowing.  
L2-L3: Loss of disc height with disc bulge, left facet hypertrophy; no 
significant central canal narrowing, mild left subarticular recess narrowing.  
No significant right neural foraminal narrowing. Mild left neural foraminal 
narrowing.  
L3-L4: Disc bulge with right foraminal to far lateral component, bilateral 
facet/ligamentum flavum hypertrophy, synovial cyst extending anteriorly and 
medially from the right facet joint; mild central canal narrowing with moderate 
right subarticular recess narrowing.  Mild right neural foraminal narrowing. No 
significant left neural foraminal narrowing.  
L4-L5: Disc bulge, bilateral facet hypertrophy; no significant central canal 
narrowing, mild bilateral subarticular recess narrowing.  No significant right 
neural foraminal narrowing. Mild left neural foraminal narrowing.  
L5-S1: Disc bulge, left foraminal annular fissure, bilateral facet hypertrophy; 
no significant central canal narrowing.  No significant right neural foraminal 
narrowing. No significant left neural foraminal narrowing.  
-------------------------------------------------------------------------------- 
------ 
CHANGES FROM PRIOR: 
Synovial cyst at L3-L4 arising from the right facet joint is new with 
progression in right subarticular recess narrowing. Progression of right 
foraminal to far lateral component of the L3-L4 disc bulge with progression in 
degree of right neural foraminal narrowing at this level. 
There has been improvement of edema at L2-L3 from improvement in active endplate 
change, however there has been progression of active endplate change at L3-L4 
anteriorly and towards the left side. 
-------------------------------------------------------------------------------- 
------
IMPRESSION: 1.  Discogenic/degenerative changes as above. 
2.  Progression of stenosis at L3-L4.

## 8387-01-17 DEATH — deceased
